# Patient Record
Sex: Male | Born: 1970 | Race: Black or African American | Hispanic: No | Marital: Single | Smoking: Current every day smoker
Health system: Southern US, Community
[De-identification: ages and names within clinical notes are randomized; demographics above are authoritative.]

---

## 2011-12-28 ENCOUNTER — Emergency Department (INDEPENDENT_AMBULATORY_CARE_PROVIDER_SITE_OTHER)
Admission: EM | Admit: 2011-12-28 | Discharge: 2011-12-28 | Disposition: A | Discharge status: Home / Self Care | Payer: 59 | Source: Home / Self Care | Attending: Emergency Medicine | Admitting: Emergency Medicine

## 2011-12-28 ENCOUNTER — Encounter (HOSPITAL_COMMUNITY): Payer: Self-pay

## 2011-12-28 DIAGNOSIS — N453 Epididymo-orchitis: Secondary | ICD-10-CM

## 2011-12-28 DIAGNOSIS — N451 Epididymitis: Secondary | ICD-10-CM

## 2011-12-28 LAB — POCT URINALYSIS DIP (DEVICE)
Leukocytes, UA: NEGATIVE
Protein, ur: 100 mg/dL — AB
Urobilinogen, UA: 0.2 mg/dL (ref 0.0–1.0)
pH: 5.5 (ref 5.0–8.0)

## 2011-12-28 MED ORDER — MELOXICAM 15 MG PO TABS: 15.0000 mg | ORAL_TABLET | Freq: Every day | ORAL | Status: DC

## 2011-12-28 MED ORDER — TRAMADOL HCL 50 MG PO TABS: 100.0000 mg | ORAL_TABLET | Freq: Three times a day (TID) | ORAL | Status: AC | PRN

## 2011-12-28 MED ORDER — HYDROCODONE-ACETAMINOPHEN 5-325 MG PO TABS
1.0000 | ORAL_TABLET | Freq: Once | ORAL | Status: AC
  Administered 2011-12-28: 1 via ORAL

## 2011-12-28 MED ORDER — HYDROCODONE-ACETAMINOPHEN 5-325 MG PO TABS
ORAL_TABLET | ORAL | Status: AC
  Filled 2011-12-28: qty 1

## 2011-12-28 MED ORDER — CIPROFLOXACIN HCL 500 MG PO TABS: 500.0000 mg | ORAL_TABLET | Freq: Two times a day (BID) | ORAL | Status: AC

## 2011-12-28 NOTE — ED Provider Notes (Signed)
Chief Complaint  Patient presents with  . Groin Pain    groin pain    History of Present Illness:   The patient is a 41 year old male with a two-week history of intermittent pain in both groin areas radiating up to the abdomen and down in the testicles. There is no swelling of the testicles no testicular mass. Denies any bulge or lump in the groin. He's had no fever, chills, or sweats. No nausea or vomiting. No urinary symptoms or urethral discharge. No penile lesions. The pain is worse if he stands up from a sitting position and better if he lies down. He has never had anything like this before.  Review of Systems:  Other than noted above, the patient denies any of the following symptoms: General:  No fevers, chills, sweats, aches, or fatigue. GI:  No abdominal pain, back pain, nausea, vomiting, diarrhea, or constipation. GU:  No dysuria, frequency, urgency, hematuria, urethral discharge, penile lesions, penile pain, testicular pain, swelling, or mass, inguinal lymphadenopathy or incontinence.  PMFSH:  Past medical history, family history, social history, meds, and allergies were reviewed.  Physical Exam:   Vital signs:  BP 143/91  Pulse 80  Temp 99.4 F (37.4 C) (Oral)  Resp 17  SpO2 98% Gen:  Alert, oriented, in no distress. Lungs:  Clear to auscultation, no wheezes, rales or rhonchi. Heart:  Regular rhythm, no gallop or murmer. Abdomen:  Flat and soft.  No tenderness to palpation, guarding, or rebound.  No hepato-splenomegaly or mass.  Bowel sounds were normally active.  No hernia. Genital exam:  He has pain to palpation in both groin areas extending up into the lower abdomen but no guarding or rebound. There are no inguinal masses or adenopathy. He has no hernia. Both testes are normal in size, moderately tender to palpation, without any swelling or masses. Back:  No CVA tenderness.  Skin:  Clear, warm and dry.  Other Labs Obtained at Urgent Care Center:  GC and Chlamydia DNA probe  were obtained as well as a UA and culture.  Results are pending at this time and we will call about any positive results.  Course in Urgent Care Center:   He was given hydrocodone/APAP 5/325 one by mouth and tolerated this well without any immediate side effects.  Assessment: The encounter diagnosis was Epididymitis.   Plan:   1.  The following meds were prescribed:   New Prescriptions   CIPROFLOXACIN (CIPRO) 500 MG TABLET    Take 1 tablet (500 mg total) by mouth every 12 (twelve) hours.   MELOXICAM (MOBIC) 15 MG TABLET    Take 1 tablet (15 mg total) by mouth daily.   TRAMADOL (ULTRAM) 50 MG TABLET    Take 2 tablets (100 mg total) by mouth every 8 (eight) hours as needed for pain.   2.  The patient was instructed in symptomatic care and handouts were given. 3.  The patient was told to return if becoming worse in any way, if no better in 3 or 4 days, and given some red flag symptoms that would indicate earlier return.  Follow up:  The patient was told to follow up with Dr. Hillis Range early next week.     Reuben Likes, MD 12/28/11 [phone]

## 2011-12-28 NOTE — ED Notes (Signed)
Pt states he has pain in both of his groin areas, states he can palpate a "lump" on both sides. Pain started 12/21/11

## 2011-12-28 NOTE — Discharge Instructions (Signed)
Epididymitis  Epididymitis is a swelling (inflammation) of the epididymis. The epididymis is a cord-like structure along the back part of the testicle. Epididymitis is usually, but not always, caused by infection. This is usually a sudden problem beginning with chills, fever and pain behind the scrotum and in the testicle. There may be swelling and redness of the testicle.  DIAGNOSIS   Physical examination will reveal a tender, swollen epididymis. Sometimes, cultures are obtained from the urine or from prostate secretions to help find out if there is an infection or if the cause is a different problem. Sometimes, blood work is performed to see if your white blood cell count is elevated and if a germ (bacterial) or viral infection is present. Using this knowledge, an appropriate medicine which kills germs (antibiotic) can be chosen by your caregiver. A viral infection causing epididymitis will most often go away (resolve) without treatment.  HOME CARE INSTRUCTIONS    Hot sitz baths for 20 minutes, 4 times per day, may help relieve pain.   Only take over-the-counter or prescription medicines for pain, discomfort or fever as directed by your caregiver.   Take all medicines, including antibiotics, as directed. Take the antibiotics for the full prescribed length of time even if you are feeling better.   It is very important to keep all follow-up appointments.  SEEK IMMEDIATE MEDICAL CARE IF:    You have a fever.   You have pain not relieved with medicines.   You have any worsening of your problems.   Your pain seems to come and go.   You develop pain, redness, and swelling in the scrotum and surrounding areas.  MAKE SURE YOU:    Understand these instructions.   Will watch your condition.   Will get help right away if you are not doing well or get worse.  Document Released: 06/21/2000 Document Revised: 06/13/2011 Document Reviewed: 05/11/2009  [employer] Patient Information 2012 [employer].

## 2011-12-30 LAB — URINE CULTURE
Colony Count: NO GROWTH
Culture  Setup Time: 201306231137
Culture: NO GROWTH

## 2011-12-31 LAB — GC/CHLAMYDIA PROBE AMP, GENITAL
Chlamydia, DNA Probe: NEGATIVE
GC Probe Amp, Genital: NEGATIVE

## 2012-08-21 ENCOUNTER — Emergency Department (HOSPITAL_COMMUNITY): Payer: 59

## 2012-08-21 ENCOUNTER — Emergency Department (HOSPITAL_COMMUNITY)
Admission: EM | Admit: 2012-08-21 | Discharge: 2012-08-22 | Disposition: A | Discharge status: Home / Self Care | Payer: 59 | Attending: Emergency Medicine | Admitting: Emergency Medicine

## 2012-08-21 ENCOUNTER — Encounter (HOSPITAL_COMMUNITY): Payer: Self-pay | Admitting: Emergency Medicine

## 2012-08-21 DIAGNOSIS — I861 Scrotal varices: Secondary | ICD-10-CM | POA: Insufficient documentation

## 2012-08-21 DIAGNOSIS — R109 Unspecified abdominal pain: Secondary | ICD-10-CM | POA: Insufficient documentation

## 2012-08-21 DIAGNOSIS — N433 Hydrocele, unspecified: Secondary | ICD-10-CM | POA: Insufficient documentation

## 2012-08-21 DIAGNOSIS — F17200 Nicotine dependence, unspecified, uncomplicated: Secondary | ICD-10-CM | POA: Insufficient documentation

## 2012-08-21 DIAGNOSIS — M545 Low back pain, unspecified: Secondary | ICD-10-CM | POA: Insufficient documentation

## 2012-08-21 DIAGNOSIS — N451 Epididymitis: Secondary | ICD-10-CM

## 2012-08-21 DIAGNOSIS — N453 Epididymo-orchitis: Secondary | ICD-10-CM | POA: Insufficient documentation

## 2012-08-21 MED ORDER — HYDROCODONE-ACETAMINOPHEN 5-325 MG PO TABS
1.0000 | ORAL_TABLET | Freq: Once | ORAL | Status: AC
  Administered 2012-08-21: 1 via ORAL
  Filled 2012-08-21: qty 1

## 2012-08-21 NOTE — ED Notes (Signed)
Intermittent L testicle pain since October. Denies discharge.

## 2012-08-21 NOTE — ED Provider Notes (Signed)
History    This chart was scribed for non-physician practitioner working with Carleene Cooper III, MD by Frederik Pear, ED Scribe. This patient was seen in room TR08C/TR08C and the patient's care was started at 2245.   CSN: [id number]  Arrival date & time 08/21/12  2011   First MD Initiated Contact with Patient 08/21/12 2245      Chief Complaint  Patient presents with  . Groin Pain  . Testicle Pain    (Consider location/radiation/quality/duration/timing/severity/associated sxs/prior treatment) The history is provided by the patient. No language interpreter was used.    Sean Sparks is a 42 y.o. male who presents to the Emergency Department complaining of intermittent, sudden onset, groin pain that radiates to his lower back and lasts constantly for 1-2 days at a time and began 5 months ago. He denies any penile discharge, fevers, nausea, emesis, or abdominal pain. He reports that he was seen by UC, but denies having an ultrasound performed. He has a h/o of peptic ulcers.   History reviewed. No pertinent past medical history.  History reviewed. No pertinent past surgical history.  No family history on file.  History  Substance Use Topics  . Smoking status: Current Every Day Smoker -- 1.00 packs/day    Types: Cigarettes  . Smokeless tobacco: Not on file  . Alcohol Use: Yes      Review of Systems  Gastrointestinal: Negative for nausea, vomiting and abdominal pain.  Genitourinary: Positive for testicular pain. Negative for discharge, penile swelling and scrotal swelling.  Musculoskeletal: Positive for back pain.  All other systems reviewed and are negative.    Allergies  Review of patient's allergies indicates no known allergies.  Home Medications  No current outpatient prescriptions on file.  BP 124/84  Pulse 89  Temp(Src) 98.4 F (36.9 C) (Oral)  Resp 22  SpO2 97%  Physical Exam  Nursing note and vitals reviewed. Constitutional: He appears well-developed and  well-nourished.  HENT:  Head: Normocephalic.  Neck: Normal range of motion. Neck supple.  Cardiovascular: Normal rate, regular rhythm and normal heart sounds.   No murmur heard. Pulmonary/Chest: Effort normal and breath sounds normal. No respiratory distress.  Abdominal: Soft. Bowel sounds are normal. There is no tenderness.  Genitourinary: Penis normal. Right testis shows no mass and no tenderness. Left testis shows tenderness. Left testis shows no mass and no swelling. Left testis is descended. Cremasteric reflex is not absent on the left side.  Musculoskeletal: Normal range of motion. He exhibits no tenderness.  Neurological: He is alert.  Psychiatric: He has a normal mood and affect. Thought content normal.    ED Course  Procedures (including critical care time)  DIAGNOSTIC STUDIES: Oxygen Saturation is 97% on room air, normal by my interpretation.    COORDINATION OF CARE:  23:03- Discussed planned course of treatment with the patient, including an ultrasound of the scrotum and UA, who is agreeable at this time.  23:15- Medication Orders- Hydrocodone-acetaminophen (norco/vicodin) 5-325 mg per tablet 1 tablet- once.   Labs Reviewed  URINALYSIS, ROUTINE W REFLEX MICROSCOPIC   US Scrotum  08/22/2012  *RADIOLOGY REPORT*  Clinical Data: Left testicular pain.  SCROTAL ULTRASOUND DOPPLER ULTRASOUND OF THE TESTICLES  Technique:  Complete ultrasound examination of the testicles, epididymis, and other scrotal structures was performed.  Color and spectral Doppler ultrasound were also utilized to evaluate blood flow to the testicles.  Comparison:  None.  Findings:  The testicles are symmetric in size and echogenicity. The right testis measures 4.3 x 2.9  x 2.3 cm, while the left testis measures 3.9 x 2.9 x 3.0 cm.  No testicular masses are seen, and there is no evidence of microlithiasis.  The left epididymal head is mildly enlarged and echogenic; no significant increased blood flow is seen, but  this could reflect mild epididymitis.  The right epididymal head is unremarkable in appearance.  Moderate bilateral hydroceles are noted, right greater than left. Bilateral varicoceles are also seen, left mildly larger than right. There is mild augmentation on Valsalva maneuver on both sides.  Blood flow is seen within both testicles on color Doppler sonography.  Doppler spectral waveforms show both arterial and venous flow signal in both testicles.  IMPRESSION:  1.  No evidence of testicular torsion; testes unremarkable in appearance. 2.  Mildly enlarged and echogenic left epididymal head.  No significant associated increased blood flow is seen, but this could reflect mild epididymitis. 3.  Moderate bilateral hydroceles, right greater than left. 4.  Bilateral varicoceles also seen, left mildly larger than right.   Original Report Authenticated By: Tonia Ghent, M.D.    [place] Art/ven Flow Abd Pelv Doppler  08/22/2012  *RADIOLOGY REPORT*  Clinical Data: Left testicular pain.  SCROTAL ULTRASOUND DOPPLER ULTRASOUND OF THE TESTICLES  Technique:  Complete ultrasound examination of the testicles, epididymis, and other scrotal structures was performed.  Color and spectral Doppler ultrasound were also utilized to evaluate blood flow to the testicles.  Comparison:  None.  Findings:  The testicles are symmetric in size and echogenicity. The right testis measures 4.3 x 2.9 x 2.3 cm, while the left testis measures 3.9 x 2.9 x 3.0 cm.  No testicular masses are seen, and there is no evidence of microlithiasis.  The left epididymal head is mildly enlarged and echogenic; no significant increased blood flow is seen, but this could reflect mild epididymitis.  The right epididymal head is unremarkable in appearance.  Moderate bilateral hydroceles are noted, right greater than left. Bilateral varicoceles are also seen, left mildly larger than right. There is mild augmentation on Valsalva maneuver on both sides.  Blood flow is seen  within both testicles on color Doppler sonography.  Doppler spectral waveforms show both arterial and venous flow signal in both testicles.  IMPRESSION:  1.  No evidence of testicular torsion; testes unremarkable in appearance. 2.  Mildly enlarged and echogenic left epididymal head.  No significant associated increased blood flow is seen, but this could reflect mild epididymitis. 3.  Moderate bilateral hydroceles, right greater than left. 4.  Bilateral varicoceles also seen, left mildly larger than right.   Original Report Authenticated By: Tonia Ghent, M.D.      1. Epididymitis   2. Varicocele   3. Hydrocele     Patient seen and examined. Work-up initiated. Medications ordered.   Vital signs reviewed and are as follows: Filed Vitals:   08/21/12 2017  BP: 124/84  Pulse: 89  Temp: 98.4 F (36.9 C)  Resp: 22   Handoff to [employer] at shift change who will f/u on [place] results and d/c appropriately.    MDM  Pending testicular US. Epididymitis?  I personally performed the services described in this documentation, which was scribed in my presence. The recorded information has been reviewed and is accurate.        Renne Crigler, [place] 08/22/12 1501

## 2012-08-21 NOTE — ED Notes (Signed)
Scrotum pain on and off since oct but just reoccurred this past wed. Denies taking any med at home.

## 2012-08-22 LAB — URINALYSIS, ROUTINE W REFLEX MICROSCOPIC
Bilirubin Urine: NEGATIVE
Ketones, ur: NEGATIVE mg/dL
Nitrite: NEGATIVE
Protein, ur: NEGATIVE mg/dL

## 2012-08-22 LAB — URINE MICROSCOPIC-ADD ON

## 2012-08-22 MED ORDER — AZITHROMYCIN 250 MG PO TABS
1000.0000 mg | ORAL_TABLET | Freq: Once | ORAL | Status: AC
  Administered 2012-08-22: 1000 mg via ORAL
  Filled 2012-08-22: qty 4

## 2012-08-22 MED ORDER — IBUPROFEN 600 MG PO TABS: 600.0000 mg | ORAL_TABLET | Freq: Four times a day (QID) | ORAL | Status: DC | PRN

## 2012-08-22 MED ORDER — CIPROFLOXACIN HCL 500 MG PO TABS: 500.0000 mg | ORAL_TABLET | Freq: Two times a day (BID) | ORAL | Status: DC

## 2012-08-22 MED ORDER — CEFTRIAXONE SODIUM 1 G IJ SOLR
1.0000 g | Freq: Once | INTRAMUSCULAR | Status: AC
  Administered 2012-08-22: 1 g via INTRAMUSCULAR
  Filled 2012-08-22: qty 10

## 2012-08-22 MED ORDER — OXYCODONE-ACETAMINOPHEN 5-325 MG PO TABS
1.0000 | ORAL_TABLET | Freq: Once | ORAL | Status: AC
  Administered 2012-08-22: 1 via ORAL
  Filled 2012-08-22: qty 1

## 2012-08-22 MED ORDER — HYDROCODONE-ACETAMINOPHEN 5-325 MG PO TABS: 1.0000 | ORAL_TABLET | Freq: Four times a day (QID) | ORAL | Status: DC | PRN

## 2012-08-22 NOTE — ED Provider Notes (Signed)
SCROTAL ULTRASOUND DOPPLER ULTRASOUND OF THE TESTICLES  Technique: Complete ultrasound examination of the testicles, epididymis, and other scrotal structures was performed. Color and spectral Doppler ultrasound were also utilized to evaluate blood flow to the testicles.  Comparison: None.  Findings: The testicles are symmetric in size and echogenicity. The right testis measures 4.3 x 2.9 x 2.3 cm, while the left testis measures 3.9 x 2.9 x 3.0 cm. No testicular masses are seen, and there is no evidence of microlithiasis.  The left epididymal head is mildly enlarged and echogenic; no significant increased blood flow is seen, but this could reflect mild epididymitis. The right epididymal head is unremarkable in appearance.  Moderate bilateral hydroceles are noted, right greater than left. Bilateral varicoceles are also seen, left mildly larger than right. There is mild augmentation on Valsalva maneuver on both sides.  Blood flow is seen within both testicles on color Doppler sonography. Doppler spectral waveforms show both arterial and venous flow signal in both testicles.  IMPRESSION:  1. No evidence of testicular torsion; testes unremarkable in appearance. 2. Mildly enlarged and echogenic left epididymal head. No significant associated increased blood flow is seen, but this could reflect mild epididymitis. 3. Moderate bilateral hydroceles, right greater than left. 4. Bilateral varicoceles also seen, left mildly larger than right.  Labs and imaging reviewed. Pt treated in ER w 1g rocephin and 1 g azith. Patient will be dc w Cipro 500 BID x 14 days. Pt denies a hx of renal issues or rectal pain. Will place on Motrin 600 BID x 10 days to help pain. Patient has been advised to rest, ice and scrotal support to help he is pain. Recommended purchasing jockstrap. Presentation non-concerning for testicular torsion or prostatitis. Patient is hemodynamically stable and in no acute distress  prior to discharge. Patient is agreeable to plan and will followup with urology if symptoms persist.    Jaci Carrel, PA-C 08/22/12 [phone]

## 2012-08-22 NOTE — ED Notes (Signed)
Patient transported to Ultrasound 

## 2012-08-22 NOTE — ED Notes (Signed)
Returned to room.

## 2012-08-24 ENCOUNTER — Telehealth (HOSPITAL_COMMUNITY): Payer: Self-pay | Admitting: Emergency Medicine

## 2012-08-24 NOTE — ED Provider Notes (Signed)
Medical screening examination/treatment/procedure(s) were performed by non-physician practitioner and as supervising physician I was immediately available for consultation/collaboration.   Carleene Cooper III, MD 08/24/12 [phone]

## 2013-07-12 IMAGING — US US ART/VEN ABD/PELV/SCROTUM DOPPLER LTD
1 series · 13 of 25 positions shown · non-contrast
Comparison: None.

CLINICAL DATA: Left testicular pain.

SCROTAL ULTRASOUND
DOPPLER ULTRASOUND OF THE TESTICLES
TECHNIQUE: Complete ultrasound examination of the testicles,
epididymis, and other scrotal structures was performed.  Color and
spectral Doppler ultrasound were also utilized to evaluate blood
flow to the testicles.

[Series 1: us art/ven abd/pelv/scrotum doppler ltd · 0.10mm/px · 13 of 50 slices shown]
[im 1/50]
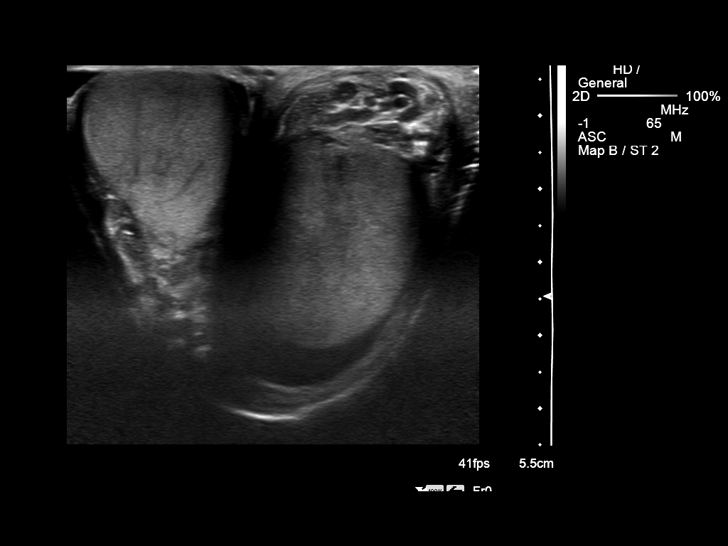
[im 5/50]
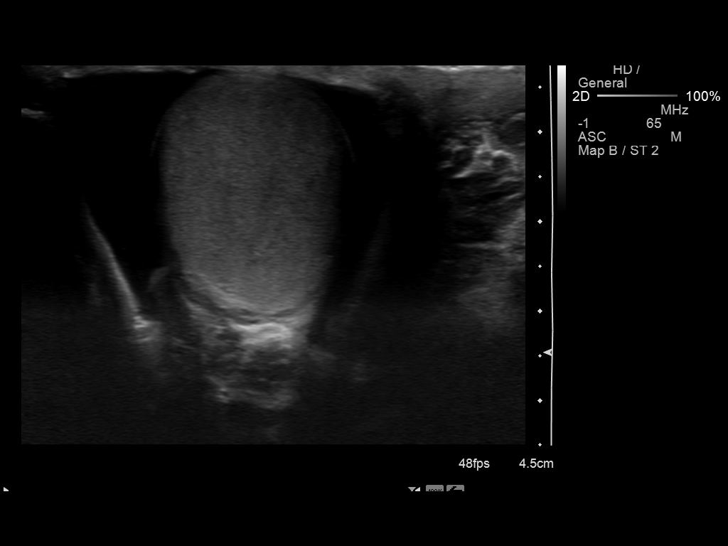
[im 9/50]
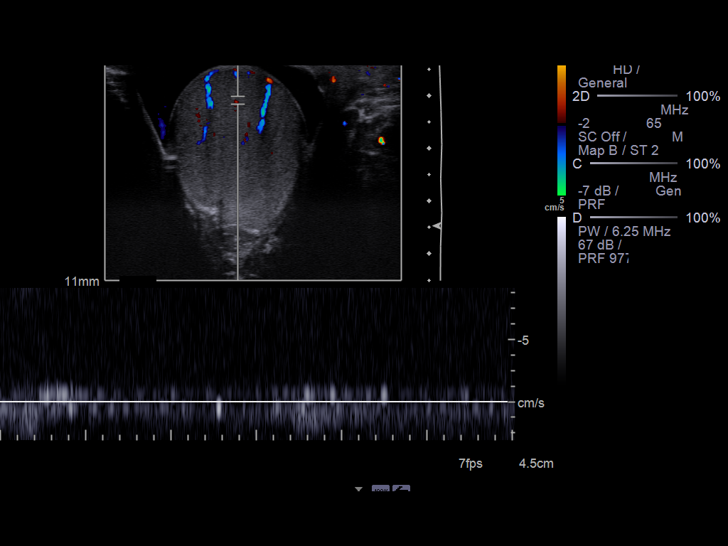
[im 13/50]
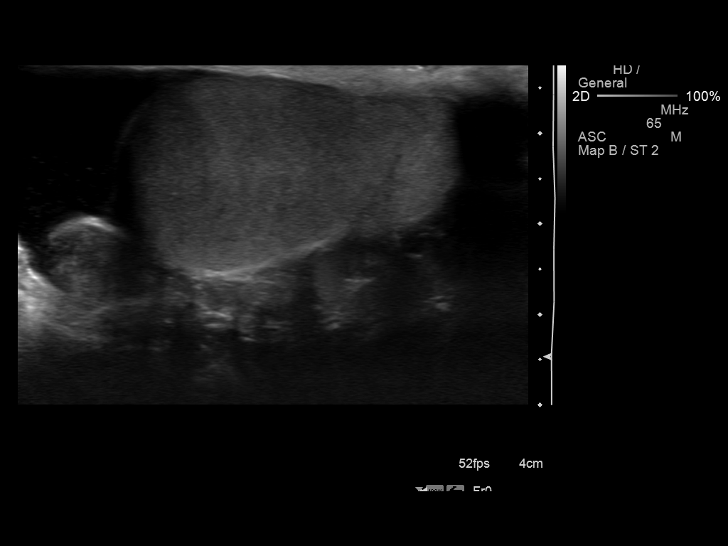
[im 17/50]
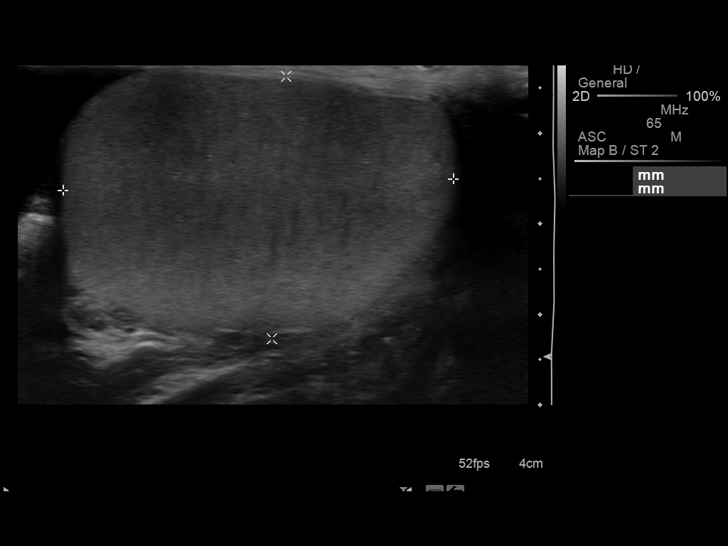
[im 21/50]
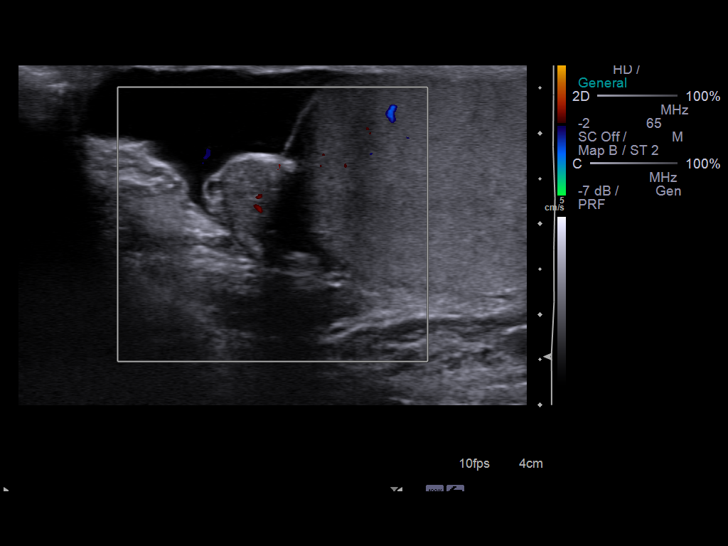
[im 25/50]
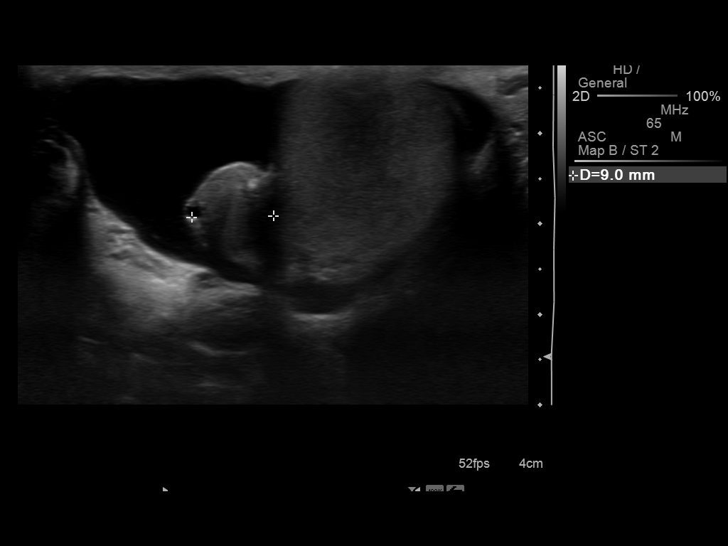
[im 29/50]
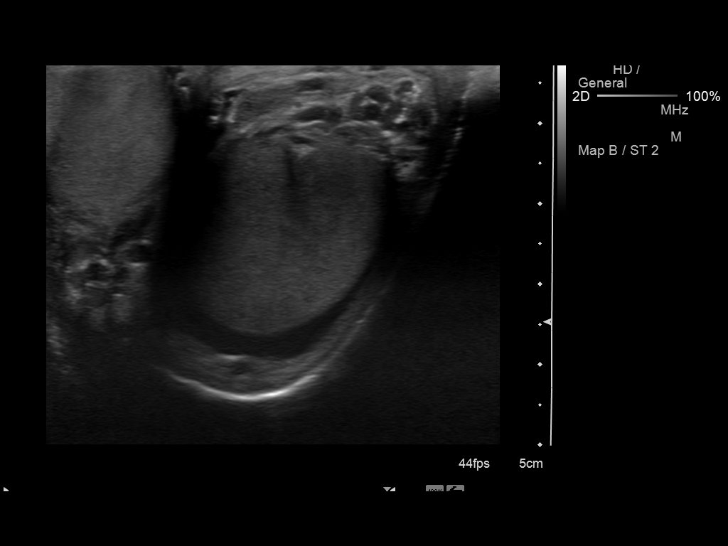
[im 33/50]
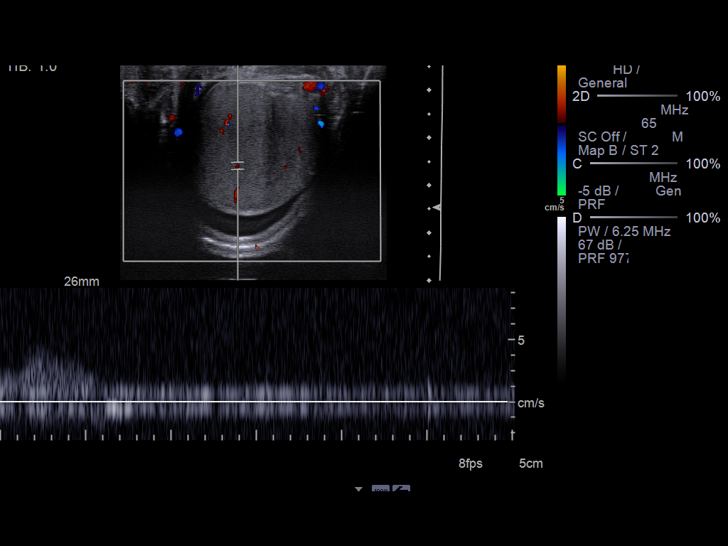
[im 37/50]
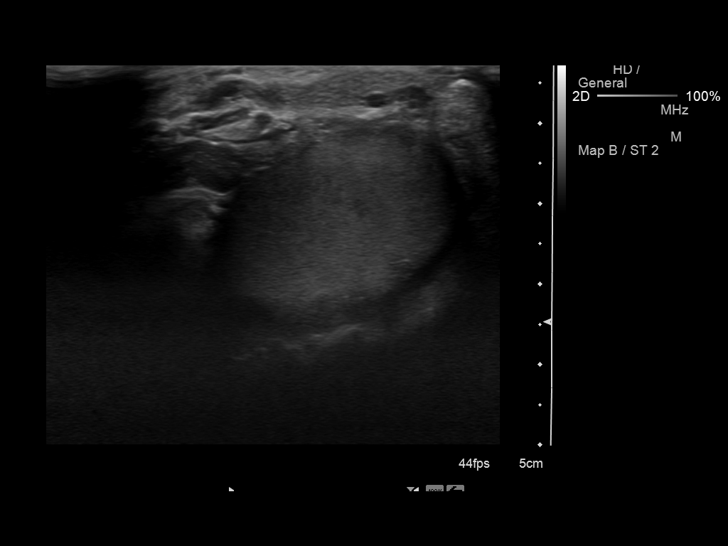
[im 41/50]
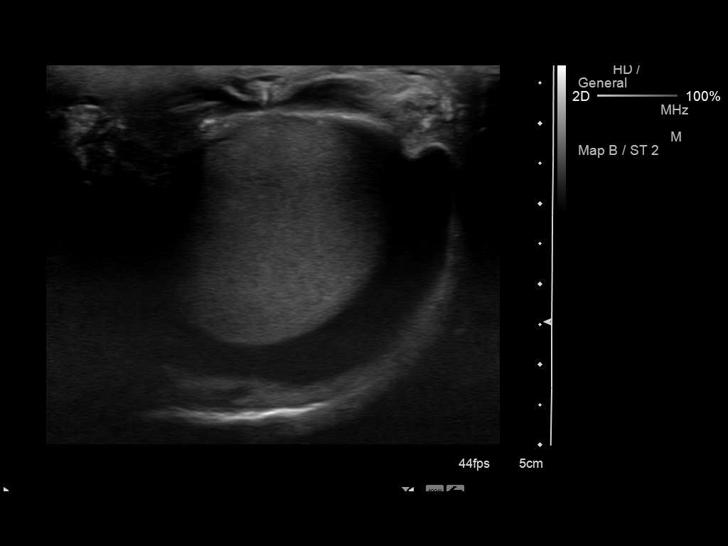
[im 45/50]
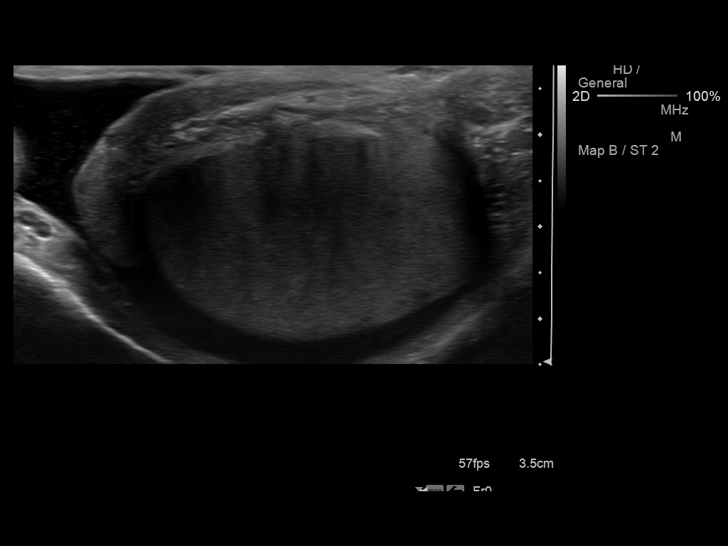
[im 50/50]
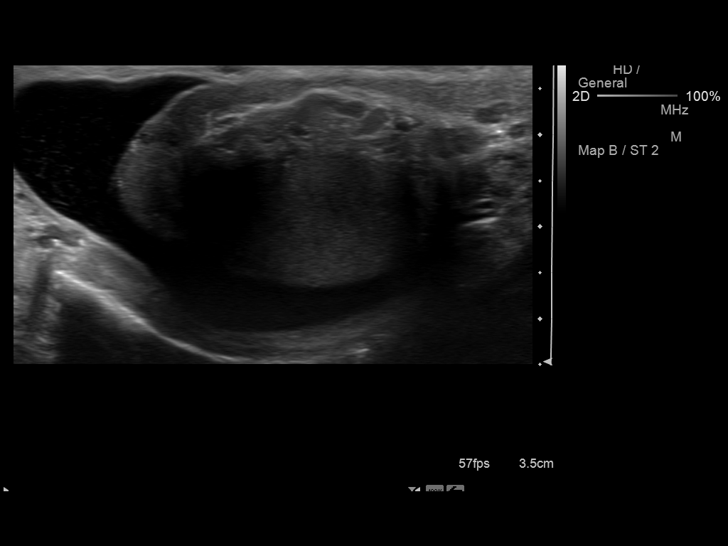

[13 of 25 positions shown; findings below may reference images not displayed]

FINDINGS: The testicles are symmetric in size and echogenicity.
The right testis measures 4.3 x 2.9 x 2.3 cm, while the left testis
measures 3.9 x 2.9 x 3.0 cm.  No testicular masses are seen, and
there is no evidence of microlithiasis.

The left epididymal head is mildly enlarged and echogenic; no
significant increased blood flow is seen, but this could reflect
mild epididymitis.  The right epididymal head is unremarkable in
appearance.

Moderate bilateral hydroceles are noted, right greater than left.
Bilateral varicoceles are also seen, left mildly larger than right.
There is mild augmentation on Valsalva maneuver on both sides.

Blood flow is seen within both testicles on color Doppler
sonography.  Doppler spectral waveforms show both arterial and
venous flow signal in both testicles.
IMPRESSION: 1.  No evidence of testicular torsion; testes unremarkable in
appearance.
2.  Mildly enlarged and echogenic left epididymal head.  No
significant associated increased blood flow is seen, but this could
reflect mild epididymitis.
3.  Moderate bilateral hydroceles, right greater than left.
4.  Bilateral varicoceles also seen, left mildly larger than right.

## 2013-09-07 ENCOUNTER — Other Ambulatory Visit (HOSPITAL_COMMUNITY)
Admission: RE | Admit: 2013-09-07 | Discharge: 2013-09-07 | Disposition: A | Discharge status: Home / Self Care | Payer: 59 | Source: Ambulatory Visit | Attending: Family Medicine | Admitting: Family Medicine

## 2013-09-07 DIAGNOSIS — Z113 Encounter for screening for infections with a predominantly sexual mode of transmission: Secondary | ICD-10-CM | POA: Insufficient documentation

## 2014-03-17 ENCOUNTER — Emergency Department (INDEPENDENT_AMBULATORY_CARE_PROVIDER_SITE_OTHER)
Admission: EM | Admit: 2014-03-17 | Discharge: 2014-03-17 | Disposition: A | Discharge status: Home / Self Care | Payer: Self-pay | Source: Home / Self Care | Attending: Family Medicine | Admitting: Family Medicine

## 2014-03-17 ENCOUNTER — Encounter (HOSPITAL_COMMUNITY): Payer: Self-pay | Admitting: Emergency Medicine

## 2014-03-17 DIAGNOSIS — R21 Rash and other nonspecific skin eruption: Secondary | ICD-10-CM

## 2014-03-17 MED ORDER — TRIAMCINOLONE ACETONIDE 0.5 % EX OINT: 1.0000 "application " | TOPICAL_OINTMENT | Freq: Two times a day (BID) | CUTANEOUS | Status: AC

## 2014-03-17 MED ORDER — TERBINAFINE HCL 250 MG PO TABS: 250.0000 mg | ORAL_TABLET | Freq: Every day | ORAL | Status: AC

## 2014-03-17 NOTE — ED Provider Notes (Signed)
Sean Sparks is a 43 y.o. male who presents to Urgent Care today for rash. Patient has a 2 week history of rash predominantly on his left arm and neck. He notes crusting itchy lesions. He has not tried any medications. No fevers or chills nausea vomiting or diarrhea. Her symptoms for this or shampoos. No fevers or chills.   History reviewed. No pertinent past medical history. History  Substance Use Topics  . Smoking status: Current Every Day Smoker -- 1.00 packs/day    Types: Cigarettes  . Smokeless tobacco: Not on file  . Alcohol Use: Yes   ROS as above Medications: No current facility-administered medications for this encounter.   Current Outpatient Prescriptions  Medication Sig Dispense Refill  . terbinafine (LAMISIL) 250 MG tablet Take 1 tablet (250 mg total) by mouth daily.  7 tablet  0  . triamcinolone ointment (KENALOG) 0.5 % Apply 1 application topically 2 (two) times daily.  30 g  0    Exam:  BP 126/97  Pulse 84  Temp(Src) 98.4 F (36.9 C) (Oral)  Resp 14  SpO2 100% Gen: Well NAD HEENT: EOMI,  MMM no oral lesions Lungs: Normal work of breathing. CTABL Heart: RRR no MRG Abd: NABS, Soft. Nondistended, Nontender Exts: Brisk capillary refill, warm and well perfused.  Skin: Multiple circular lesions with crusting and scaling left arm. Nontender.  No results found for this or any previous visit (from the past 24 hour(s)). No results found.  Assessment and Plan: 42 y.o. male with rash. Likely tinea corporis possible contact dermatitis. Plan to treat with oral Lamisil topical triamcinolone.  Discussed warning signs or symptoms. Please see discharge instructions. Patient expresses understanding.   This note was created using Conservation officer, historic buildings. Any transcription errors are unintended.    Rodolph Bong, MD 03/17/14 [phone]

## 2014-03-17 NOTE — ED Notes (Signed)
C/o  Rash on arms and ears.  On set 2 wks. Ago.  Denies any changes in soaps or detergents.  No new medications.   Pt has tried applying lotion for comfort with no relief.

## 2014-03-17 NOTE — Discharge Instructions (Signed)
Thank you for coming in today. Take Lamisil daily for one week Use triamcinolone cream as needed.    Contact Dermatitis Contact dermatitis is a reaction to certain substances that touch the skin. Contact dermatitis can be either irritant contact dermatitis or allergic contact dermatitis. Irritant contact dermatitis does not require previous exposure to the substance for a reaction to occur.Allergic contact dermatitis only occurs if you have been exposed to the substance before. Upon a repeat exposure, your body reacts to the substance.  CAUSES  Many substances can cause contact dermatitis. Irritant dermatitis is most commonly caused by repeated exposure to mildly irritating substances, such as:  Makeup.  Soaps.  Detergents.  Bleaches.  Acids.  Metal salts, such as nickel. Allergic contact dermatitis is most commonly caused by exposure to:  Poisonous plants.  Chemicals (deodorants, shampoos).  Jewelry.  Latex.  Neomycin in triple antibiotic cream.  Preservatives in products, including clothing. SYMPTOMS  The area of skin that is exposed may develop:  Dryness or flaking.  Redness.  Cracks.  Itching.  Pain or a burning sensation.  Blisters. With allergic contact dermatitis, there may also be swelling in areas such as the eyelids, mouth, or genitals.  DIAGNOSIS  Your caregiver can usually tell what the problem is by doing a physical exam. In cases where the cause is uncertain and an allergic contact dermatitis is suspected, a patch skin test may be performed to help determine the cause of your dermatitis. TREATMENT Treatment includes protecting the skin from further contact with the irritating substance by avoiding that substance if possible. Barrier creams, powders, and gloves may be helpful. Your caregiver may also recommend:  Steroid creams or ointments applied 2 times daily. For best results, soak the rash area in cool water for 20 minutes. Then apply the  medicine. Cover the area with a plastic wrap. You can store the steroid cream in the refrigerator for a "chilly" effect on your rash. That may decrease itching. Oral steroid medicines may be needed in more severe cases.  Antibiotics or antibacterial ointments if a skin infection is present.  Antihistamine lotion or an antihistamine taken by mouth to ease itching.  Lubricants to keep moisture in your skin.  Burow's solution to reduce redness and soreness or to dry a weeping rash. Mix one packet or tablet of solution in 2 cups cool water. Dip a clean washcloth in the mixture, wring it out a bit, and put it on the affected area. Leave the cloth in place for 30 minutes. Do this as often as possible throughout the day.  Taking several cornstarch or baking soda baths daily if the area is too large to cover with a washcloth. Harsh chemicals, such as alkalis or acids, can cause skin damage that is like a burn. You should flush your skin for 15 to 20 minutes with cold water after such an exposure. You should also seek immediate medical care after exposure. Bandages (dressings), antibiotics, and pain medicine may be needed for severely irritated skin.  HOME CARE INSTRUCTIONS  Avoid the substance that caused your reaction.  Keep the area of skin that is affected away from hot water, soap, sunlight, chemicals, acidic substances, or anything else that would irritate your skin.  Do not scratch the rash. Scratching may cause the rash to become infected.  You may take cool baths to help stop the itching.  Only take over-the-counter or prescription medicines as directed by your caregiver.  See your caregiver for follow-up care as directed to  make sure your skin is healing properly. SEEK MEDICAL CARE IF:   Your condition is not better after 3 days of treatment.  You seem to be getting worse.  You see signs of infection such as swelling, tenderness, redness, soreness, or warmth in the affected  area.  You have any problems related to your medicines. Document Released: 06/21/2000 Document Revised: 09/16/2011 Document Reviewed: 11/27/2010 [facility] Patient Information 2015 [place], [place]. This information is not intended to replace advice given to you by your health care provider. Make sure you discuss any questions you have with your health care provider.  Body Ringworm Ringworm (tinea corporis) is a fungal infection of the skin on the body. This infection is not caused by worms, but is actually caused by a fungus. Fungus normally lives on the top of your skin and can be useful. However, in the case of ringworms, the fungus grows out of control and causes a skin infection. It can involve any area of skin on the body and can spread easily from one person to another (contagious). Ringworm is a common problem for children, but it can affect adults as well. Ringworm is also often found in athletes, especially wrestlers who share equipment and mats.  CAUSES  Ringworm of the body is caused by a fungus called dermatophyte. It can spread by:  Touchingother people who are infected.  Touchinginfected pets.  Touching or sharingobjects that have been in contact with the infected person or pet (hats, combs, towels, clothing, sports equipment). SYMPTOMS   Itchy, raised red spots and bumps on the skin.  Ring-shaped rash.  Redness near the border of the rash with a clear center.  Dry and scaly skin on or around the rash. Not every person develops a ring-shaped rash. Some develop only the red, scaly patches. DIAGNOSIS  Most often, ringworm can be diagnosed by performing a skin exam. Your caregiver may choose to take a skin scraping from the affected area. The sample will be examined under the microscope to see if the fungus is present.  TREATMENT  Body ringworm may be treated with a topical antifungal cream or ointment. Sometimes, an antifungal shampoo that can be used on your body is  prescribed. You may be prescribed antifungal medicines to take by mouth if your ringworm is severe, keeps coming back, or lasts a long time.  HOME CARE INSTRUCTIONS   Only take over-the-counter or prescription medicines as directed by your caregiver.  Wash the infected area and dry it completely before applying yourcream or ointment.  When using antifungal shampoo to treat the ringworm, leave the shampoo on the body for 3-5 minutes before rinsing.   Wear loose clothing to stop clothes from rubbing and irritating the rash.  Wash or change your bed sheets every night while you have the rash.  Have your pet treated by your veterinarian if it has the same infection. To prevent ringworm:   Practice good hygiene.  Wear sandals or shoes in public places and showers.  Do not share personal items with others.  Avoid touching red patches of skin on other people.  Avoid touching pets that have bald spots or wash your hands after doing so. SEEK MEDICAL CARE IF:   Your rash continues to spread after 7 days of treatment.  Your rash is not gone in 4 weeks.  The area around your rash becomes red, warm, tender, and swollen. Document Released: 06/21/2000 Document Revised: 03/18/2012 Document Reviewed: 01/06/2012 [facility] Patient Information 2015 [place], [place]. This information  is not intended to replace advice given to you by your health care provider. Make sure you discuss any questions you have with your health care provider.
